# Patient Record
Sex: Male | Born: 2000 | Race: White | Hispanic: No | Smoking: Never smoker
Health system: Southern US, Community
[De-identification: ages and names within clinical notes are randomized; demographics above are authoritative.]

---
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
## 2019-04-24 IMAGING — CT CT NECK W/ CM
3 of 5 series · 12 of 33 positions shown, 14 images · IV contrast (omnipaque)
Comparison: None.

CLINICAL DATA: Initial evaluation for acute sore throat.

EXAM:
CT NECK WITH CONTRAST
TECHNIQUE: Multidetector CT imaging of the neck was performed using the
standard protocol following the bolus administration of intravenous
contrast.
CONTRAST:  75mL OMNIPAQUE IOHEXOL 300 MG/ML  SOLN

[Series 6: coronal neck · coronal · 0.38mm/px · 3 of 120 slices shown]
[im 24/120  bone]
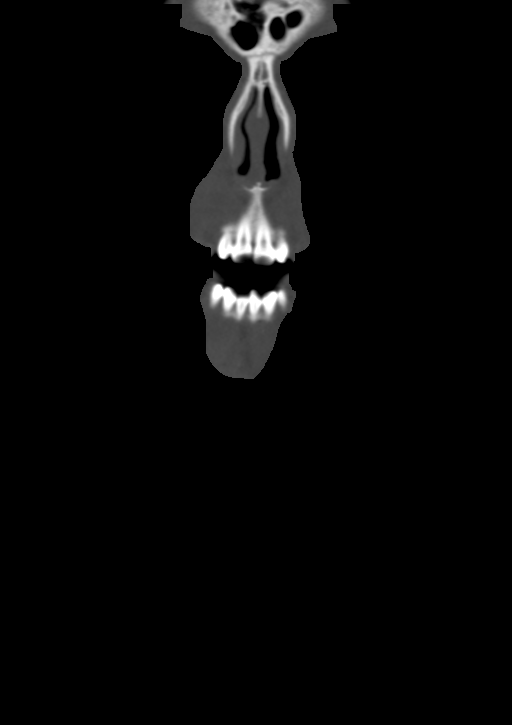
[im 48/120  bone]
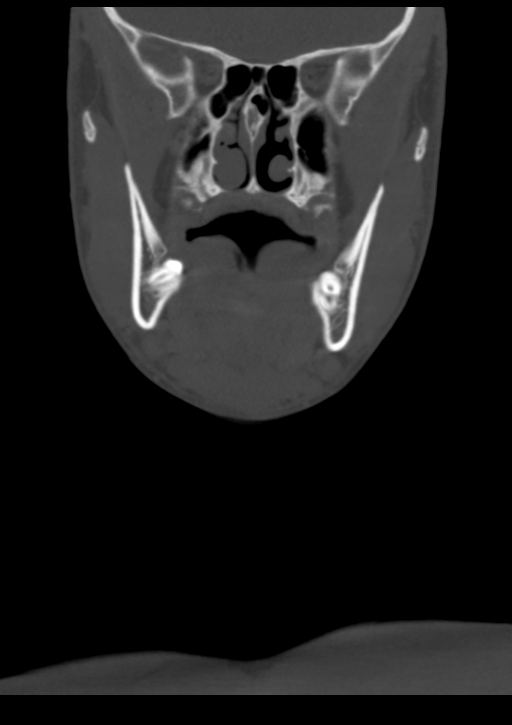
[im 72/120  bone]
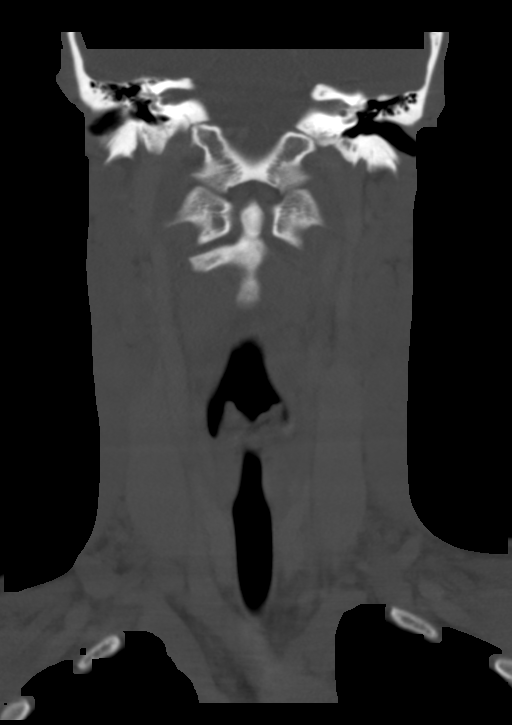

[Series 7: sagittal neck · sagittal · 0.47mm/px · 5 of 101 slices shown, 6 images]
[im 34/101  bone]
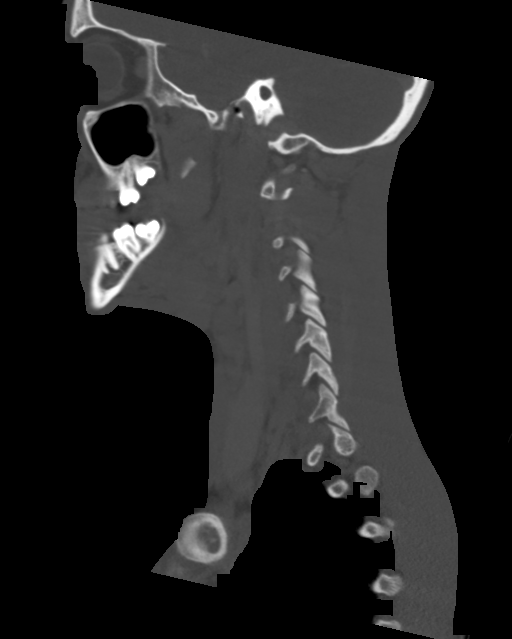
[im 42/101  bone]
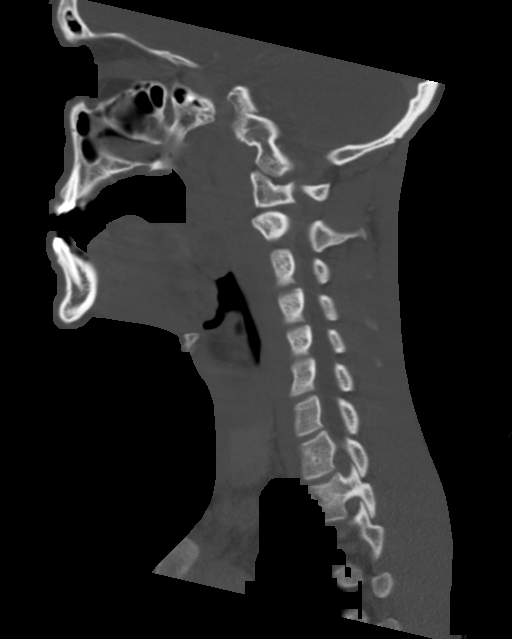
[im 51/101  soft-tissue]
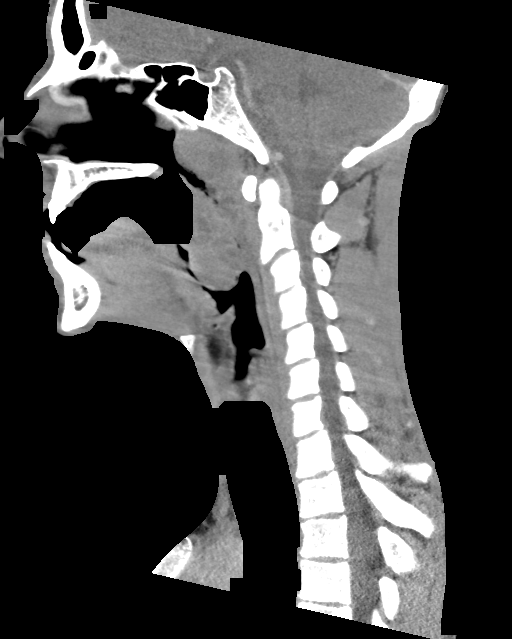
[im 51/101  bone]
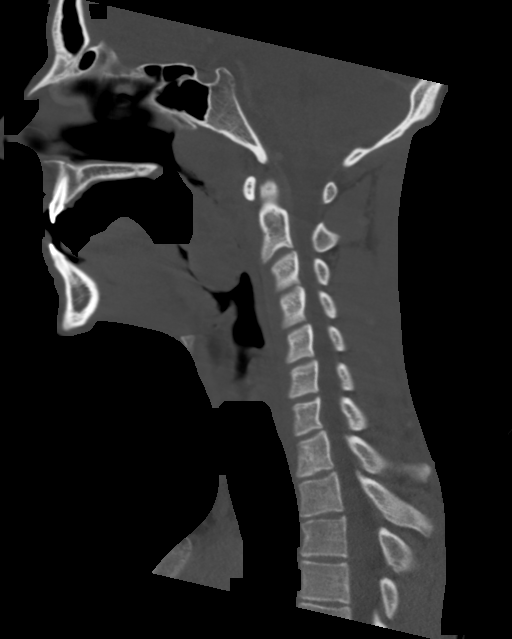
[im 59/101  bone]
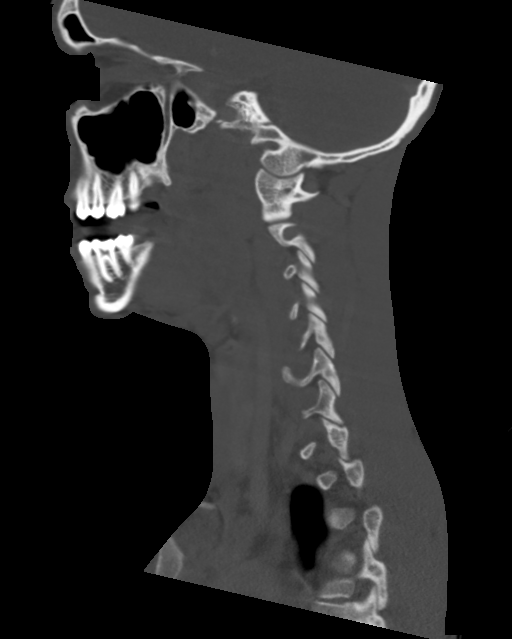
[im 67/101  bone]
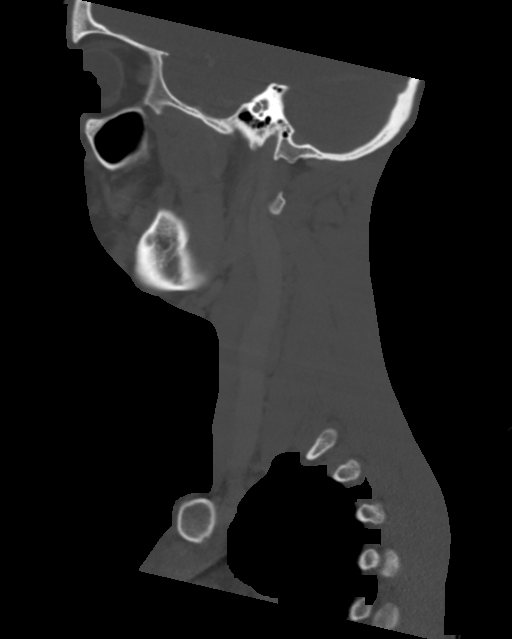

[Series 8: orthogonal ax · axial · 0.39mm/px · z∈[-193,-27]mm · 4 of 143 slices shown, 5 images]
[im 29/143  soft-tissue]
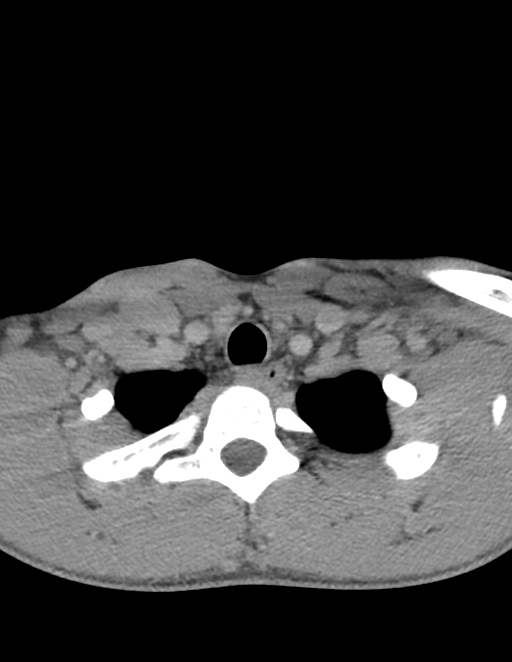
[im 29/143  bone]
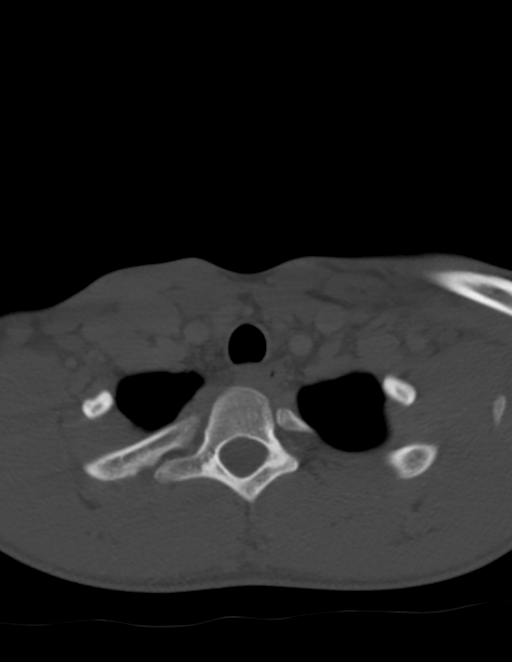
[im 57/143  bone]
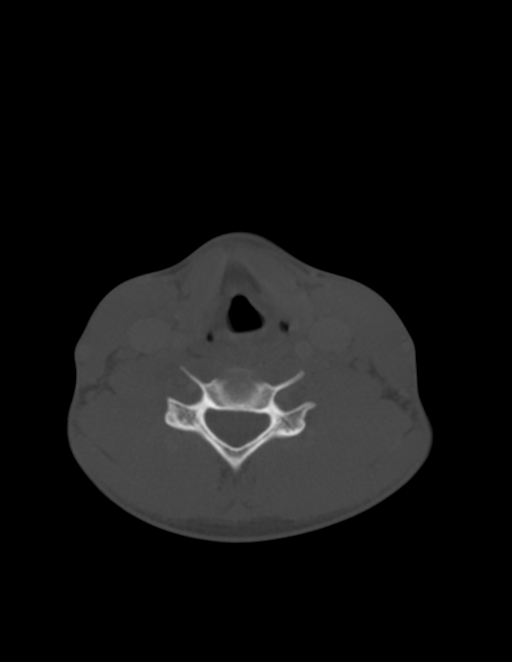
[im 86/143  bone]
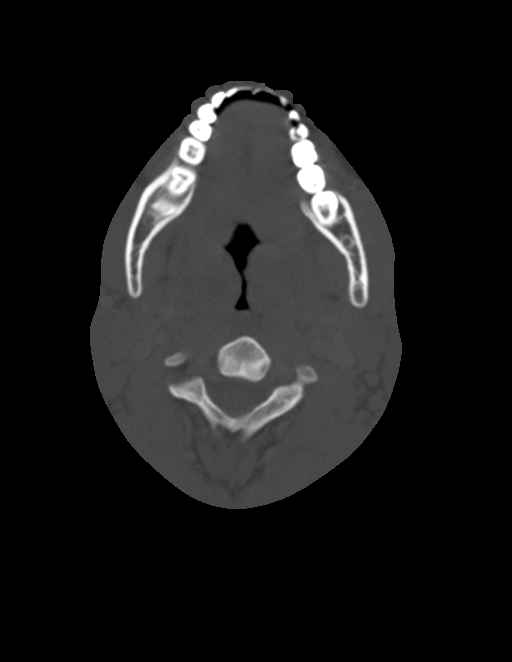
[im 114/143  bone]
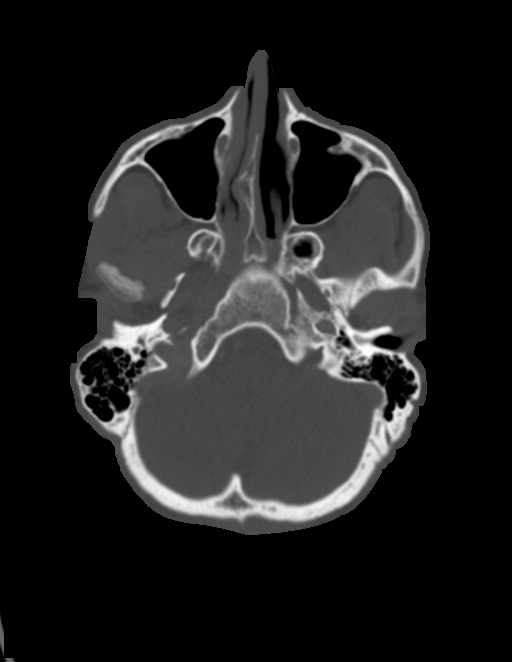

[12 of 33 positions shown; findings below may reference images not displayed]

FINDINGS: Pharynx and larynx: Oral cavity within normal limits without mass
lesion or loculated collection. No acute abnormality about the
dentition. Palatine tonsils are markedly enlarged and hyperenhancing
bilaterally, consistent with acute tonsillitis, left slightly larger
than right. No discrete tonsillar or peritonsillar abscess.
Associated mild induration with inflammatory stranding within the
parapharyngeal fat bilaterally. Diffuse prominence of the adenoidal
soft tissues noted. Small amount of rib secretions seen layering
within the nasopharynx. Associated trace retropharyngeal effusion
without frank abscess or collection. Epiglottis is normal without
evidence for acute epiglottitis. Vallecula clear. Remainder of the
hypopharynx and supraglottic larynx within normal limits. True cords
symmetric and normal. Subglottic airway clear.

Salivary glands: Salivary glands including the parotid and
submandibular glands within normal limits.

Thyroid: Thyroid within normal limits.

Lymph nodes: Multiple prominent and enlarged cervical lymph nodes
seen within the neck bilaterally, most pronounced at level II. These
measure up to approximately 18 mm on the left and 20 mm on the
right. Additional shotty subcentimeter nodes seen at essentially all
stations as well as the supraclavicular regions, likely reactive.

Vascular: Normal intravascular enhancement seen throughout the neck.

Limited intracranial: Unremarkable.

Visualized orbits: Unremarkable.

Mastoids and visualized paranasal sinuses: Paranasal sinuses are
clear. Mastoid air cells and middle ear cavities are well
pneumatized and free of fluid.

Skeleton: No acute osseous abnormality. No discrete lytic or blastic
osseous lesions.

Upper chest: Visualized upper chest demonstrates no acute finding.

Other: None.
IMPRESSION: 1. Diffuse enlargement of the palatine tonsils and adenoidal soft
tissues, consistent with acute tonsillitis. No discrete tonsillar or
peritonsillar abscess. No evidence for acute epiglottitis.
2. Extensive adenopathy throughout the visualized neck, most likely
reactive. Clinical follow-up to resolution recommended.

## 2020-05-16 ENCOUNTER — Ambulatory Visit: Payer: Self-pay | Attending: Internal Medicine

## 2020-05-16 ENCOUNTER — Other Ambulatory Visit: Payer: Self-pay

## 2020-05-16 DIAGNOSIS — Z23 Encounter for immunization: Secondary | ICD-10-CM

## 2020-05-16 NOTE — Progress Notes (Signed)
   [facility]  Name:  Vincent Shannon    MRN: [id number] DOB: 2000-09-13  05/16/2020  Mr. Vincent Shannon was observed post Covid-19 immunization for 30 minutes based on pre-vaccination screening without incident. He was provided with Vaccine Information Sheet and instruction to access the V-Safe system.   Mr. Vincent Shannon was instructed to call 911 with any severe reactions post vaccine: Marland Kitchen Difficulty breathing  . Swelling of face and throat  . A fast heartbeat  . A bad rash all over body  . Dizziness and weakness   Immunizations Administered    Name Date Dose VIS Date Route   JANSSEN COVID-19 VACCINE 05/16/2020  3:19 PM 0.5 mL 09/28/2019 Intramuscular   Manufacturer: [employer]   Lot: [id number]   NDC: [phone]
